# Patient Record
Sex: Male | Born: 2000 | Hispanic: Yes | Marital: Single | Smoking: Never smoker
Health system: Southern US, Community
[De-identification: ages and names within clinical notes are randomized; demographics above are authoritative.]

## PROBLEM LIST (undated history)

## (undated) DIAGNOSIS — J45909 Unspecified asthma, uncomplicated: Secondary | ICD-10-CM

## (undated) HISTORY — PX: TONSILLECTOMY: SUR1361

---
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
## 2022-02-24 DIAGNOSIS — F341 Dysthymic disorder: Secondary | ICD-10-CM | POA: Diagnosis not present

## 2022-03-01 DIAGNOSIS — F341 Dysthymic disorder: Secondary | ICD-10-CM | POA: Diagnosis not present

## 2022-03-09 DIAGNOSIS — F341 Dysthymic disorder: Secondary | ICD-10-CM | POA: Diagnosis not present

## 2022-03-16 DIAGNOSIS — F341 Dysthymic disorder: Secondary | ICD-10-CM | POA: Diagnosis not present

## 2022-03-22 DIAGNOSIS — F341 Dysthymic disorder: Secondary | ICD-10-CM | POA: Diagnosis not present

## 2022-03-30 DIAGNOSIS — F341 Dysthymic disorder: Secondary | ICD-10-CM | POA: Diagnosis not present

## 2022-04-05 DIAGNOSIS — F341 Dysthymic disorder: Secondary | ICD-10-CM | POA: Diagnosis not present

## 2022-04-08 ENCOUNTER — Emergency Department (HOSPITAL_COMMUNITY)
Admission: EM | Admit: 2022-04-08 | Discharge: 2022-04-09 | Disposition: A | Discharge status: Home / Self Care | Payer: Medicaid Other | Attending: Emergency Medicine | Admitting: Emergency Medicine

## 2022-04-08 ENCOUNTER — Encounter (HOSPITAL_COMMUNITY): Payer: Self-pay | Admitting: Emergency Medicine

## 2022-04-08 ENCOUNTER — Other Ambulatory Visit: Payer: Self-pay

## 2022-04-08 DIAGNOSIS — Z1152 Encounter for screening for COVID-19: Secondary | ICD-10-CM | POA: Diagnosis not present

## 2022-04-08 DIAGNOSIS — R0981 Nasal congestion: Secondary | ICD-10-CM | POA: Diagnosis not present

## 2022-04-08 DIAGNOSIS — J45909 Unspecified asthma, uncomplicated: Secondary | ICD-10-CM | POA: Diagnosis not present

## 2022-04-08 LAB — RESP PANEL BY RT-PCR (RSV, FLU A&B, COVID)  RVPGX2
Influenza A by PCR: NEGATIVE
Influenza B by PCR: NEGATIVE
Resp Syncytial Virus by PCR: NEGATIVE
SARS Coronavirus 2 by RT PCR: NEGATIVE

## 2022-04-08 NOTE — ED Triage Notes (Signed)
Pt states they have had congestion for 2 days and now has a cough and intermittent headaches. Pt denies any sore throat, fever, n/v/d. NAD

## 2022-04-09 NOTE — ED Provider Notes (Signed)
[facility] Emergency Department Provider Note MRN:  [id number]  Arrival date & time: 04/09/22     Chief Complaint   Nasal congestion History of Present Illness   Peter Saunders is a 22 y.o. year-old male with a history of asthma presenting to the ED with chief complaint of nasal congestion.  Nasal congestion and mild sore throat for the past 2 days.  Review of Systems  A thorough review of systems was obtained and all systems are negative except as noted in the HPI and PMH.   Patient's Health History    Past Medical History:  Diagnosis Date   Asthma     Past Surgical History:  Procedure Laterality Date   TONSILLECTOMY     TONSILLECTOMY      Family History  Problem Relation Age of Onset   Clotting disorder Mother    Depression Mother    Hypertension Maternal Grandmother    Diabetes Maternal Grandmother    Arthritis Maternal Grandmother     Social History   Socioeconomic History   Marital status: Single    Spouse name: Not on file   Number of children: Not on file   Years of education: Not on file   Highest education level: Not on file  Occupational History   Not on file  Tobacco Use   Smoking status: Never    Passive exposure: Yes   Smokeless tobacco: Never  Vaping Use   Vaping Use: Never used  Substance and Sexual Activity   Alcohol use: Yes    Comment: occassional   Drug use: No   Sexual activity: Not on file  Other Topics Concern   Not on file  Social History Narrative   8th grade      Lives with mother    Social Determinants of Health   Financial Resource Strain: Not on file  Food Insecurity: Not on file  Transportation Needs: Not on file  Physical Activity: Not on file  Stress: Not on file  Social Connections: Not on file  Intimate Partner Violence: Not on file     Physical Exam   Vitals:   04/08/22 2136  BP: 134/85  Pulse: (!) 56  Resp: 18  Temp: 98.1 F (36.7 C)  SpO2: 99%    CONSTITUTIONAL:  Well-appearing, NAD NEURO/PSYCH:  Alert and oriented x 3, no focal deficits EYES:  eyes equal and reactive ENT/NECK:  no LAD, no JVD CARDIO: Regular rate, well-perfused, normal S1 and S2 PULM:  CTAB no wheezing or rhonchi GI/GU:  non-distended, non-tender MSK/SPINE:  No gross deformities, no edema SKIN:  no rash, atraumatic   *Additional and/or pertinent findings included in MDM below  Diagnostic and Interventional Summary    EKG Interpretation  Date/Time:    Ventricular Rate:    PR Interval:    QRS Duration:   QT Interval:    QTC Calculation:   R Axis:     Text Interpretation:         Labs Reviewed  RESP PANEL BY RT-PCR (RSV, FLU A&B, COVID)  [id number]    No orders to display    Medications - No data to display   Procedures  /  Critical Care Procedures  ED Course and Medical Decision Making  Initial Impression and Ddx Minimal URI symptoms.  No chest pain or shortness of breath, normal vital signs, clear lung, benign appearing posterior oropharynx.  Past medical/surgical history that increases complexity of ED encounter: None  Interpretation of Diagnostics COVID flu  and RSV negative  Patient Reassessment and Ultimate Disposition/Management     Discharge  Patient management required discussion with the following services or consulting groups:  None  Complexity of Problems Addressed Acute complicated illness or Injury  Additional Data Reviewed and Analyzed Further history obtained from: None  Additional Factors Impacting ED Encounter Risk None  [name], [place] [email or web address]  Final Clinical Impressions(s) / ED Diagnoses     ICD-10-CM   1. Nasal congestion  R09.81       ED Discharge Orders     None        Discharge Instructions Discussed with and Provided to Patient:    Discharge Instructions      You were evaluated in the Emergency Department and after careful  evaluation, we did not find any emergent condition requiring admission or further testing in the hospital.  Your exam/testing today is overall reassuring.  Please return to the Emergency Department if you experience any worsening of your condition.   Thank you for allowing [place] to be a part of your care.      Maudie Flakes, MD 04/09/22 [phone]

## 2022-04-09 NOTE — Discharge Instructions (Signed)
You were evaluated in the Emergency Department and after careful evaluation, we did not find any emergent condition requiring admission or further testing in the hospital.  Your exam/testing today is overall reassuring.  Please return to the Emergency Department if you experience any worsening of your condition.   Thank you for allowing us to be a part of your care.
